# Patient Record
Sex: Male | Born: 2000 | Race: White | Hispanic: No | Marital: Single | Smoking: Never smoker
Health system: Southern US, Community
[De-identification: ages and names within clinical notes are randomized; demographics above are authoritative.]

---
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
## 2020-01-15 ENCOUNTER — Encounter (HOSPITAL_COMMUNITY): Payer: Self-pay | Admitting: *Deleted

## 2020-01-15 ENCOUNTER — Other Ambulatory Visit: Payer: Self-pay

## 2020-01-15 ENCOUNTER — Emergency Department (HOSPITAL_COMMUNITY)
Admission: EM | Admit: 2020-01-15 | Discharge: 2020-01-15 | Disposition: A | Discharge status: Home / Self Care | Payer: BC Managed Care – PPO | Attending: Emergency Medicine | Admitting: Emergency Medicine

## 2020-01-15 DIAGNOSIS — Y929 Unspecified place or not applicable: Secondary | ICD-10-CM | POA: Insufficient documentation

## 2020-01-15 DIAGNOSIS — Y939 Activity, unspecified: Secondary | ICD-10-CM | POA: Insufficient documentation

## 2020-01-15 DIAGNOSIS — W292XXA Contact with other powered household machinery, initial encounter: Secondary | ICD-10-CM | POA: Insufficient documentation

## 2020-01-15 DIAGNOSIS — Y999 Unspecified external cause status: Secondary | ICD-10-CM | POA: Diagnosis not present

## 2020-01-15 DIAGNOSIS — S61412A Laceration without foreign body of left hand, initial encounter: Secondary | ICD-10-CM | POA: Insufficient documentation

## 2020-01-15 DIAGNOSIS — S61411A Laceration without foreign body of right hand, initial encounter: Secondary | ICD-10-CM

## 2020-01-15 DIAGNOSIS — Z23 Encounter for immunization: Secondary | ICD-10-CM | POA: Diagnosis not present

## 2020-01-15 MED ORDER — TETANUS-DIPHTH-ACELL PERTUSSIS 5-2.5-18.5 LF-MCG/0.5 IM SUSP
0.5000 mL | Freq: Once | INTRAMUSCULAR | Status: AC
  Administered 2020-01-15: 0.5 mL via INTRAMUSCULAR
  Filled 2020-01-15: qty 0.5

## 2020-01-15 MED ORDER — LIDOCAINE-EPINEPHRINE (PF) 2 %-1:200000 IJ SOLN
5.0000 mL | Freq: Once | INTRAMUSCULAR | Status: DC
  Filled 2020-01-15: qty 10

## 2020-01-15 NOTE — ED Provider Notes (Signed)
[facility] EMERGENCY DEPARTMENT Provider Note   CSN: [id number] Arrival date & time: 01/15/20  0210     History Chief Complaint  Patient presents with  . Laceration    Randy Riley is a 19 y.o. male with no significant past medical history presents the ED with laceration.  Patient reportedly was on a table "play fighting" with a friend of his when the blade of a ceiling fan struck his right hand.  He has a 3 cm linear laceration on the ulnar aspect of his right hand, lateral to the fifth metacarpal.  He denies any weakness or numbness, blood thinner use, bleeding disorder, significant pain, concern for fracture, lightheadedness, or other injury.  He is unsure as to when his last tetanus vaccine was administered.  He does have a primary care provider in [place], [place].  He works at the [employer].  HPI     History reviewed. No pertinent past medical history.  There are no problems to display for this patient.   History reviewed. No pertinent surgical history.     No family history on file.  Social History   Tobacco Use  . Smoking status: Never Smoker  . Smokeless tobacco: Current User  Substance Use Topics  . Alcohol use: Yes  . Drug use: Not on file    Home Medications Prior to Admission medications   Not on File    Allergies    Patient has no known allergies.  Review of Systems   Review of Systems  Constitutional: Negative for fever.  Musculoskeletal: Negative for arthralgias.  Skin: Positive for color change and wound.  Neurological: Negative for weakness, light-headedness and numbness.  Hematological: Does not bruise/bleed easily.    Physical Exam Updated Vital Signs BP 125/64 (BP Location: Left Arm)   Pulse 65   Temp 99 F (37.2 C) (Oral)   Resp 14   Ht 5\' 7"  (1.702 m)   Wt 59 kg   SpO2 100%   BMI 20.36 kg/m   Physical Exam Vitals and nursing note reviewed. Exam conducted with a chaperone present.  Constitutional:       Appearance: Normal appearance.  HENT:     Head: Normocephalic and atraumatic.  Eyes:     General: No scleral icterus.    Conjunctiva/sclera: Conjunctivae normal.  Pulmonary:     Effort: Pulmonary effort is normal.  Musculoskeletal:     Comments: Right hand: 3 cm linear laceration, relatively well approximated, over ulnar aspect, lateral to the fifth metacarpal.  Bleeding relatively well controlled.  Ulnar, median, and radial nerves assessed and all intact.  Capillary refill and radial pulse intact.  Can make a fist and flex and extend fifth phalange against resistance.  Skin:    General: Skin is dry.  Neurological:     Mental Status: He is alert.     GCS: GCS eye subscore is 4. GCS verbal subscore is 5. GCS motor subscore is 6.  Psychiatric:        Mood and Affect: Mood normal.        Behavior: Behavior normal.        Thought Content: Thought content normal.     ED Results / Procedures / Treatments   Labs (all labs ordered are listed, but only abnormal results are displayed) Labs Reviewed - No data to display  EKG None  Radiology No results found.  Procedures Procedures (including critical care time)  Medications Ordered in ED Medications  lidocaine-EPINEPHrine (XYLOCAINE W/EPI)  2 %-1:200000 (PF) injection 5 mL (has no administration in time range)  Tdap (BOOSTRIX) injection 0.5 mL (0.5 mLs Intramuscular Given 01/15/20 0855)    ED Course  I have reviewed the triage vital signs and the nursing notes.  Pertinent labs & imaging results that were available during my care of the patient were reviewed by me and considered in my medical decision making (see chart for details).    MDM Rules/Calculators/A&P                          We will update patient's tetanus with Boostrix injection.  We will anesthetized with lidocaine with epinephrine and repair with 5-0 Prolene sutures.  There was no bony tenderness to palpation over the fifth metacarpal or elsewhere on the hand.   ROM and strength was fully intact and there was no diminished sensation.  Capillary refill intact.  No significant swelling.  Do not feel as though imaging is warranted.  Plate did not break and there is low suspicion of foreign body.  I irrigated the wound thoroughly prior to laceration repair.  Before and after the procedure the motor function, strength, sensation, and circulatory function was assessed.  After the procedure patient had intact motor function with 5/5 strength (equal to unaffected side) to all joints of the with the only sensory changes attributable to the local anesthetic.  The angle of the laceration was <45 degrees, so larger bites were taken to avoid tearing through the skin.    Patient is instructed to have his sutures removed in 10 to 12 days.  Wound care instructions provided.  Recommended that he apply topical bacitracin ointment to help with cosmetic outcome.    Final Clinical Impression(s) / ED Diagnoses Final diagnoses:  Laceration of right hand without foreign body, initial encounter    Rx / DC Orders ED Discharge Orders    None       Lorelee New, PA-C 01/15/20 [id number]    Eber Hong, MD 01/15/20 [phone]

## 2020-01-15 NOTE — Discharge Instructions (Signed)
Please read the attachment on laceration care.  Keep the area clean and dry.  I recommend that you take ibuprofen or Tylenol as needed for symptoms of discomfort.  I recommend that you apply a topical bacitracin or similar antibiotic ointment to help aid in cosmetic outcome and mitigate risk of infection.  Please watch out for evidence of infection.  I would like you to follow-up with your primary care provider in 10 to 12 days for suture removal.  If you cannot see them, I recommend that you go to an urgent care.  Return to the ED or seek immediate medical attention should you experience any new or worsening symptoms.

## 2020-01-15 NOTE — ED Triage Notes (Signed)
The pt has a laceration ot the rt hand  He accidentally cutg it on a ceilinbg fan  blededing moderately on arrival  Bulky bandage placed on the 2 inch long wound
